# Patient Record
Sex: Male | Born: 1965 | Hispanic: Yes | Marital: Single | Smoking: Never smoker
Health system: Southern US, Community
[De-identification: ages and names within clinical notes are randomized; demographics above are authoritative.]

## PROBLEM LIST (undated history)

## (undated) DIAGNOSIS — M940 Chondrocostal junction syndrome [Tietze]: Secondary | ICD-10-CM

## (undated) DIAGNOSIS — N411 Chronic prostatitis: Secondary | ICD-10-CM

## (undated) DIAGNOSIS — R7303 Prediabetes: Secondary | ICD-10-CM

## (undated) DIAGNOSIS — R350 Frequency of micturition: Secondary | ICD-10-CM

## (undated) DIAGNOSIS — K219 Gastro-esophageal reflux disease without esophagitis: Secondary | ICD-10-CM

## (undated) DIAGNOSIS — I10 Essential (primary) hypertension: Secondary | ICD-10-CM

## (undated) DIAGNOSIS — N400 Enlarged prostate without lower urinary tract symptoms: Secondary | ICD-10-CM

## (undated) HISTORY — DX: Chondrocostal junction syndrome (tietze): M94.0

## (undated) HISTORY — DX: Chronic prostatitis: N41.1

## (undated) HISTORY — DX: Essential (primary) hypertension: I10

## (undated) HISTORY — PX: APPENDECTOMY: SHX54

## (undated) HISTORY — DX: Benign prostatic hyperplasia without lower urinary tract symptoms: N40.0

## (undated) HISTORY — DX: Gastro-esophageal reflux disease without esophagitis: K21.9

## (undated) HISTORY — DX: Frequency of micturition: R35.0

## (undated) HISTORY — DX: Prediabetes: R73.03

---

## 2010-05-12 ENCOUNTER — Emergency Department: Payer: Self-pay | Admitting: Emergency Medicine

## 2010-05-21 ENCOUNTER — Emergency Department: Payer: Self-pay | Admitting: Emergency Medicine

## 2012-01-18 IMAGING — CT CT HEAD WITHOUT CONTRAST
2 series · 16 of 30 positions shown, 20 images · non-contrast
Comparison: none

REASON FOR EXAM: dizzy
COMMENTS:

PROCEDURE:     CT  - CT HEAD WITHOUT CONTRAST  - May 12, 2010 [DATE]
RESULT:     Technique: Helical 5mm sections were obtained from the skull
base to the vertex without administration of intravenous contrast.

[Series 2: without · axial · non-contrast · 0.40mm/px · z∈[+226,+362]mm · 13 of 33 slices shown, 17 images]
[im 3/33  brain]
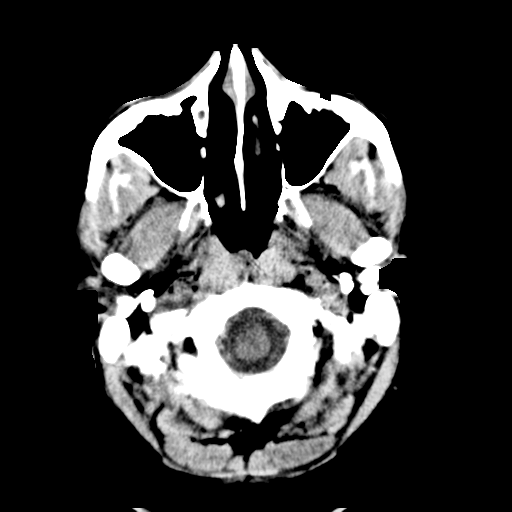
[im 3/33  bone]
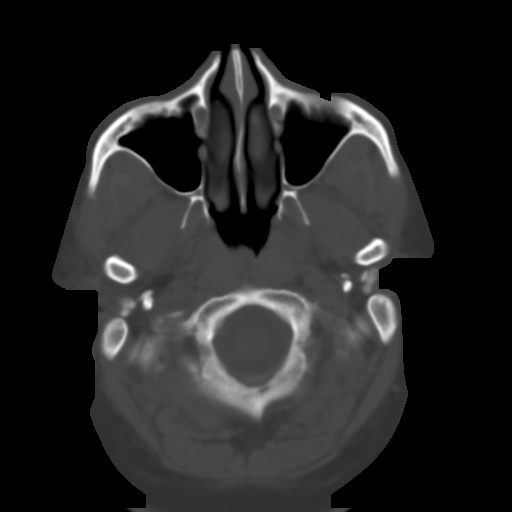
[im 5/33  brain]
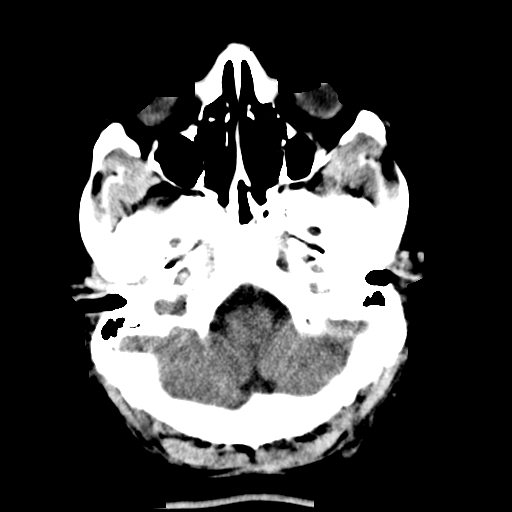
[im 7/33  brain]
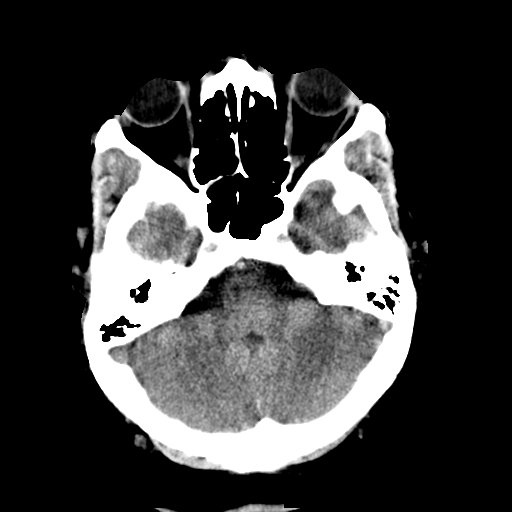
[im 10/33  brain]
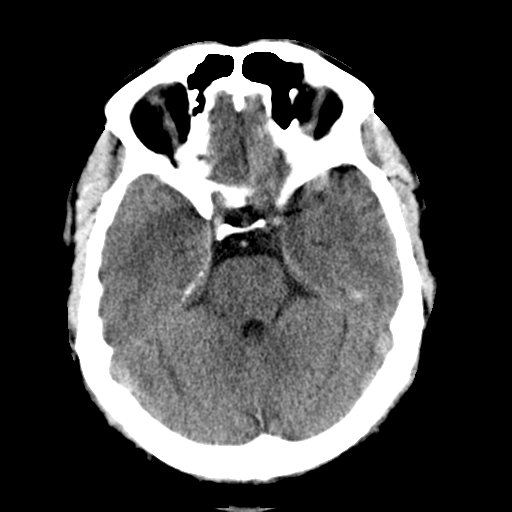
[im 12/33  brain]
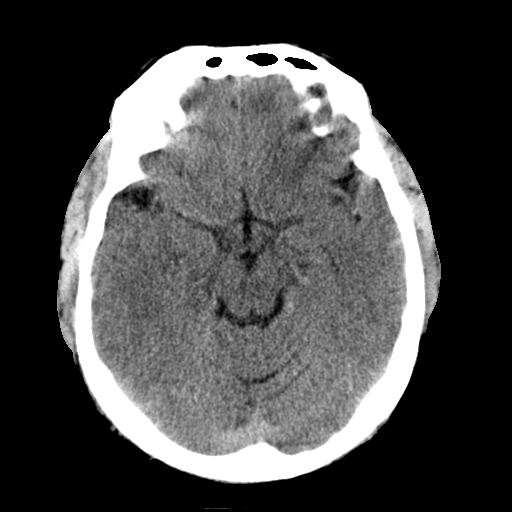
[im 12/33  bone]
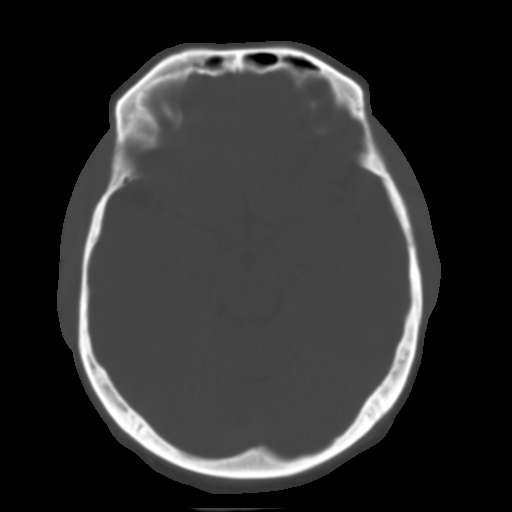
[im 14/33  brain]
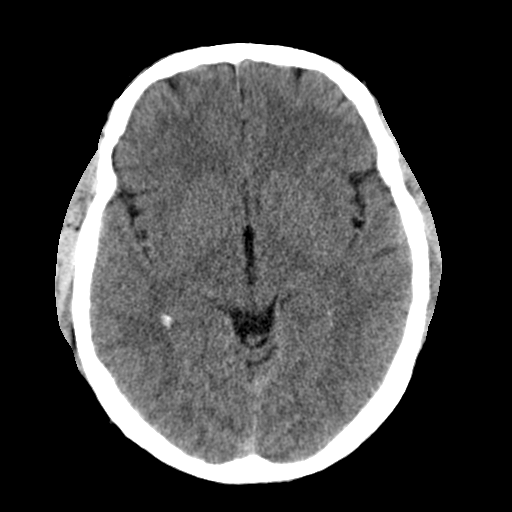
[im 17/33  brain]
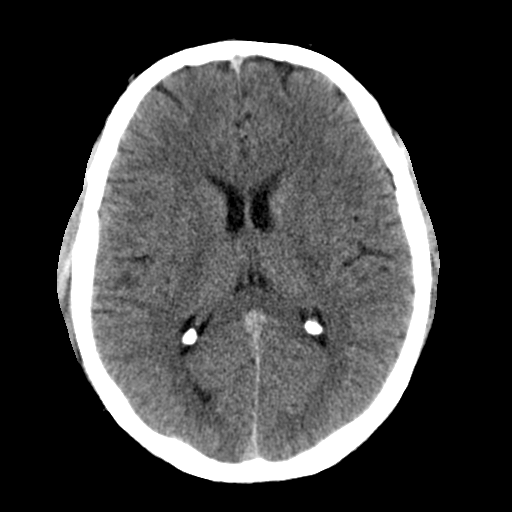
[im 19/33  brain]
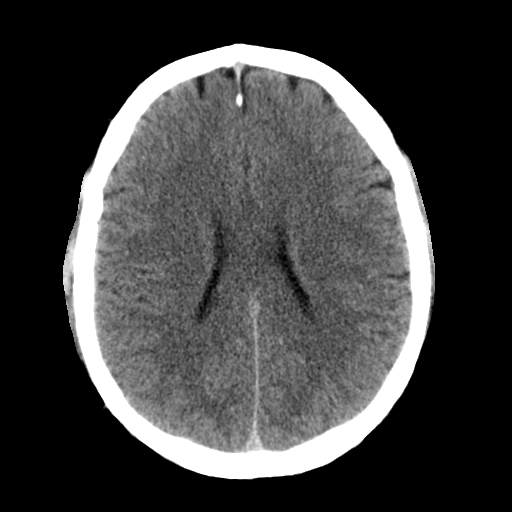
[im 21/33  brain]
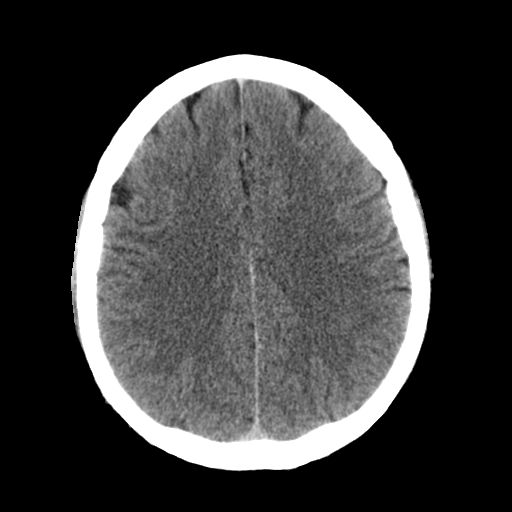
[im 21/33  bone]
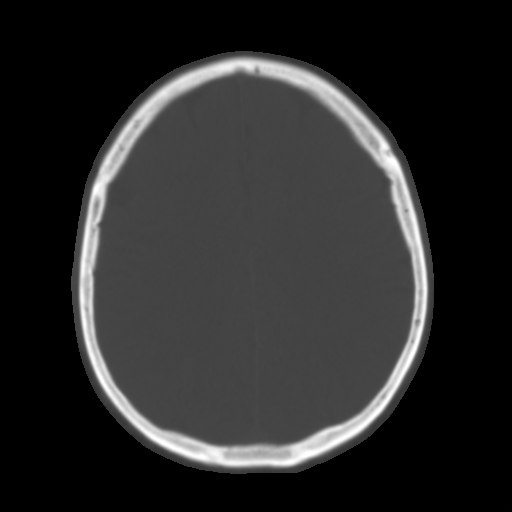
[im 23/33  brain]
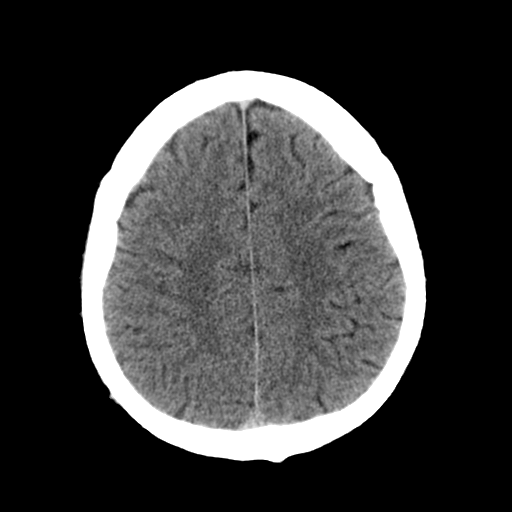
[im 26/33  brain]
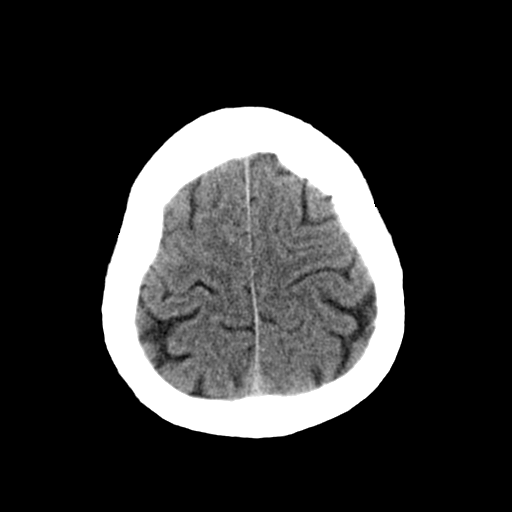
[im 28/33  brain]
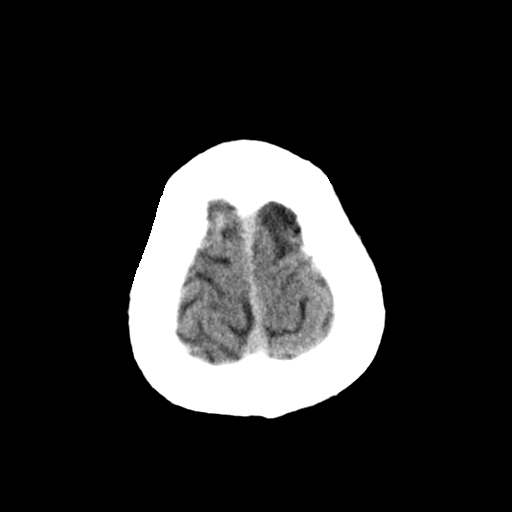
[im 30/33  brain]
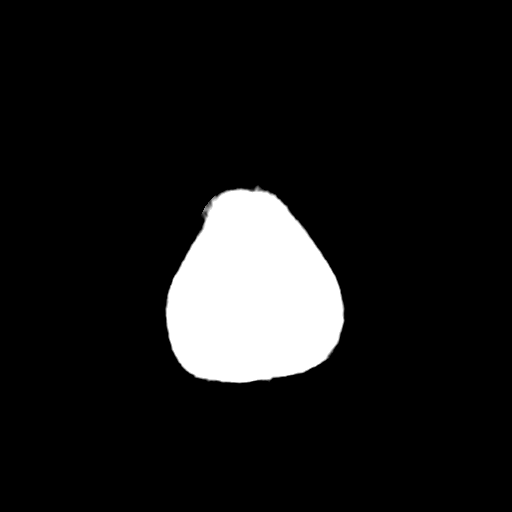
[im 30/33  bone]
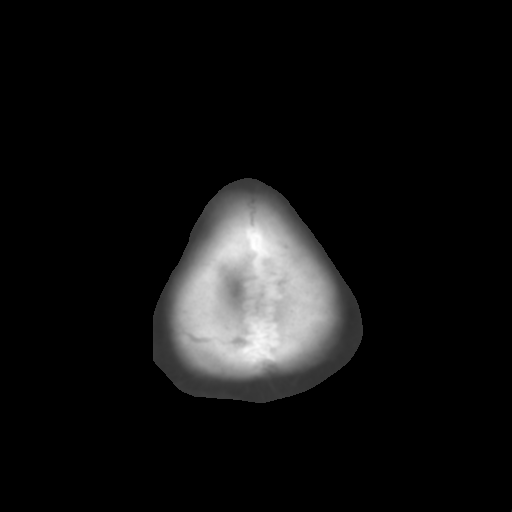

[Series 3: bone · axial · 0.40mm/px · z∈[+226,+272]mm · 3 of 32 slices shown]
[im 3/32  bone]
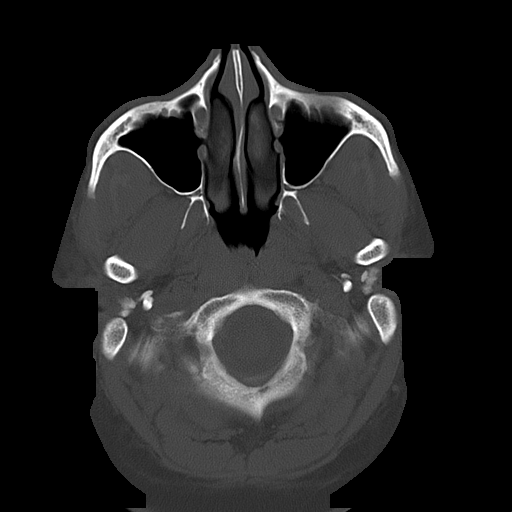
[im 7/32  bone]
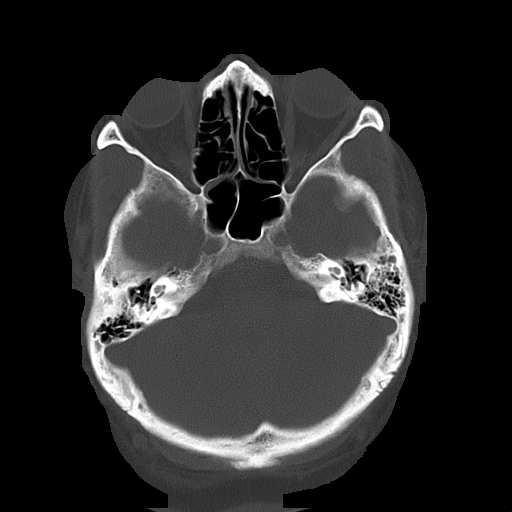
[im 12/32  bone]
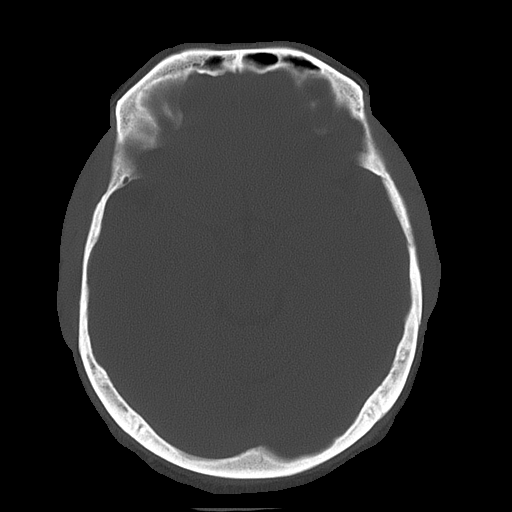

[16 of 30 positions shown; findings below may reference images not displayed]

FINDINGS: There is not evidence of intra-axial fluid collections. There is
no evidence of acute hemorrhage or secondary signs reflecting mass effect or
subacute or chronic focal territorial infarction. The osseous structures
demonstrate no evidence of a depressed skull fracture. If there is
persistent concern clinical follow-up with MRI is recommended.
IMPRESSION: 1. No evidence of acute intracranial abnormalitites.
2. A preliminary faxed report was relayed to the patient's attending
emergency room physician.

## 2014-09-04 ENCOUNTER — Ambulatory Visit: Payer: Self-pay | Admitting: Urology

## 2019-05-23 ENCOUNTER — Other Ambulatory Visit: Payer: Self-pay | Admitting: Primary Care

## 2019-05-23 DIAGNOSIS — N5082 Scrotal pain: Secondary | ICD-10-CM

## 2019-05-26 ENCOUNTER — Other Ambulatory Visit: Payer: Self-pay

## 2019-05-26 ENCOUNTER — Ambulatory Visit
Admission: RE | Admit: 2019-05-26 | Discharge: 2019-05-26 | Disposition: A | Discharge status: Home / Self Care | Payer: 59 | Source: Ambulatory Visit | Attending: Primary Care | Admitting: Primary Care

## 2019-05-26 DIAGNOSIS — N5082 Scrotal pain: Secondary | ICD-10-CM | POA: Diagnosis present

## 2020-11-07 ENCOUNTER — Encounter: Payer: Self-pay | Admitting: Urology

## 2020-11-07 ENCOUNTER — Other Ambulatory Visit: Payer: Self-pay

## 2020-11-07 ENCOUNTER — Ambulatory Visit: Payer: 59 | Admitting: Urology

## 2020-11-07 VITALS — BP 135/81 | HR 71 | Ht 66.0 in | Wt 185.0 lb

## 2020-11-07 DIAGNOSIS — N401 Enlarged prostate with lower urinary tract symptoms: Secondary | ICD-10-CM

## 2020-11-07 DIAGNOSIS — N5082 Scrotal pain: Secondary | ICD-10-CM | POA: Diagnosis not present

## 2020-11-07 NOTE — Progress Notes (Signed)
   11/07/2020 5:00 PM   CHAUNCE WINKELS [facility] 1965-03-29 [id number]  Referring provider: Sandrea Hughs, NP [address] [place],  [place] [address]  Chief Complaint  Patient presents with   Testicle Pain    HPI: Derek Melton is a 55 y.o. male who presents for evaluation of scrotal pain.  Saw PCP early 2021 with complaints of scrotal pain Scrotal sonogram was ordered which showed normal-appearing testes without mass, a 5 mm left spermatocele He notes occasional twinges of scrotal pain which are short-lived and he is currently asymptomatic.  He was concerned about the possibility of testicular cancer He also wanted to have his prostate checked.  He is on tamsulosin and has no bothersome lower urinary tract symptoms PSA July 2022 was 2.5  PMH: Past Medical History:  Diagnosis Date   BPH (benign prostatic hyperplasia)    Chronic prostatitis    Costalchondritis    GERD (gastroesophageal reflux disease)    Hypertension    Pre-diabetes    Urinary frequency     Surgical History: Past Surgical History:  Procedure Laterality Date   APPENDECTOMY      Home Medications:  Allergies as of 11/07/2020   No Known Allergies      Medication List        Accurate as of November 07, 2020  5:00 PM. If you have any questions, ask your nurse or doctor.          lisinopril-hydrochlorothiazide 20-25 MG tablet Commonly known as: ZESTORETIC Take 1 tablet by mouth daily.   tamsulosin 0.4 MG Caps capsule Commonly known as: FLOMAX Take 0.4 mg by mouth daily.        Allergies: No Known Allergies  Family History: Family History  Problem Relation Age of Onset   Hypertension Other    Prostate cancer Other     Social History:  reports that he has never smoked. He has never used smokeless tobacco. He reports that he does not drink alcohol. No history on file for drug use.   Physical Exam: BP 135/81   Pulse 71   Ht 5\' 6"  (1.676 m)   Wt 185 lb (83.9 kg)   BMI  29.86 kg/m   Constitutional:  Alert and oriented, No acute distress. HEENT: [place] AT, moist mucus membranes.  Trachea midline, no masses. Cardiovascular: No clubbing, cyanosis, or edema. Respiratory: Normal respiratory effort, no increased work of breathing. GI: Abdomen is soft, nontender, nondistended, no abdominal masses GU: Phallus without lesions, testes descended bilateral without masses or tenderness.  Small left varicocele.  No evidence of hernia Skin: No rashes, bruises or suspicious lesions. Neurologic: Grossly intact, no focal deficits, moving all 4 extremities. Psychiatric: Normal mood and affect.   Assessment & Plan:    1. Scrotal pain Intermittent scrotal pain He was reassured that there are no findings on scrotal ultrasound that are suspicious for cancer Call for worsening pain  2.  BPH with LUTS Stable on tamsulosin He requests annual follow-up for BPH   , MD  [facility] [address] [place], [place] [id number]

## 2021-11-10 ENCOUNTER — Encounter: Payer: Self-pay | Admitting: Urology

## 2021-11-10 ENCOUNTER — Ambulatory Visit: Payer: 59 | Admitting: Urology
